# Patient Record
Sex: Male | Born: 1970 | Race: Black or African American | Hispanic: No | Marital: Married | Smoking: Never smoker
Health system: Southern US, Community
[De-identification: ages and names within clinical notes are randomized; demographics above are authoritative.]

---

## 2007-04-25 ENCOUNTER — Ambulatory Visit: Payer: Self-pay | Admitting: Internal Medicine

## 2007-05-06 ENCOUNTER — Ambulatory Visit: Payer: Self-pay | Admitting: Internal Medicine

## 2007-05-13 ENCOUNTER — Ambulatory Visit: Payer: Self-pay | Admitting: Internal Medicine

## 2007-05-29 ENCOUNTER — Ambulatory Visit: Payer: Self-pay | Admitting: Internal Medicine

## 2007-06-29 ENCOUNTER — Ambulatory Visit: Payer: Self-pay | Admitting: Internal Medicine

## 2007-07-12 ENCOUNTER — Ambulatory Visit: Payer: Self-pay | Admitting: Internal Medicine

## 2007-07-29 ENCOUNTER — Ambulatory Visit: Payer: Self-pay | Admitting: Internal Medicine

## 2007-08-03 ENCOUNTER — Observation Stay: Payer: Self-pay | Admitting: Internal Medicine

## 2007-08-03 ENCOUNTER — Other Ambulatory Visit: Payer: Self-pay

## 2007-08-29 ENCOUNTER — Ambulatory Visit: Payer: Self-pay | Admitting: Internal Medicine

## 2007-09-06 ENCOUNTER — Ambulatory Visit: Payer: Self-pay | Admitting: Internal Medicine

## 2007-09-28 ENCOUNTER — Ambulatory Visit: Payer: Self-pay | Admitting: Internal Medicine

## 2007-10-29 ENCOUNTER — Ambulatory Visit: Payer: Self-pay | Admitting: Internal Medicine

## 2007-12-29 ENCOUNTER — Ambulatory Visit: Payer: Self-pay | Admitting: Internal Medicine

## 2008-01-24 ENCOUNTER — Ambulatory Visit: Payer: Self-pay | Admitting: Internal Medicine

## 2008-01-29 ENCOUNTER — Ambulatory Visit: Payer: Self-pay | Admitting: Internal Medicine

## 2008-03-11 ENCOUNTER — Emergency Department: Payer: Self-pay | Admitting: Emergency Medicine

## 2008-06-28 ENCOUNTER — Ambulatory Visit: Payer: Self-pay | Admitting: Internal Medicine

## 2008-07-03 ENCOUNTER — Ambulatory Visit: Payer: Self-pay | Admitting: Internal Medicine

## 2008-07-28 ENCOUNTER — Ambulatory Visit: Payer: Self-pay | Admitting: Internal Medicine

## 2008-08-17 ENCOUNTER — Emergency Department: Payer: Self-pay | Admitting: Emergency Medicine

## 2008-09-27 ENCOUNTER — Ambulatory Visit: Payer: Self-pay | Admitting: Internal Medicine

## 2008-10-08 ENCOUNTER — Ambulatory Visit: Payer: Self-pay | Admitting: Internal Medicine

## 2008-10-09 ENCOUNTER — Ambulatory Visit: Payer: Self-pay | Admitting: Internal Medicine

## 2008-10-28 ENCOUNTER — Ambulatory Visit: Payer: Self-pay | Admitting: Internal Medicine

## 2010-02-10 IMAGING — US US EXTREM LOW VENOUS*L*
1 series · 17 of 24 positions shown · non-contrast
Comparison: none

REASON FOR EXAM: f/u left popliteal thrombosis
COMMENTS:

[Series 1: us extrem low venous*left* · 17 of 32 slices shown]
[im 1/32]
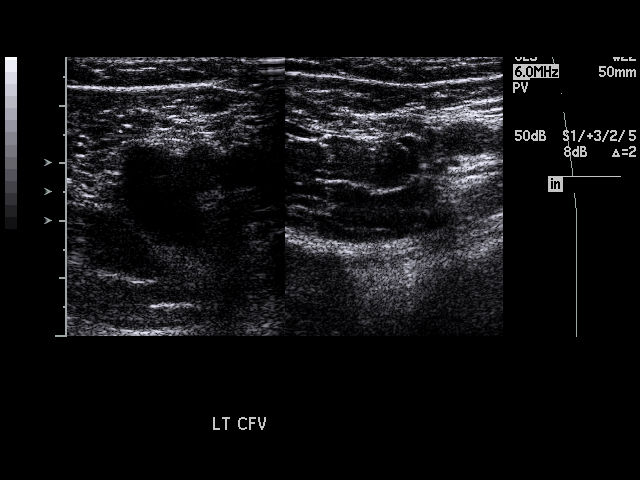
[im 3/32]
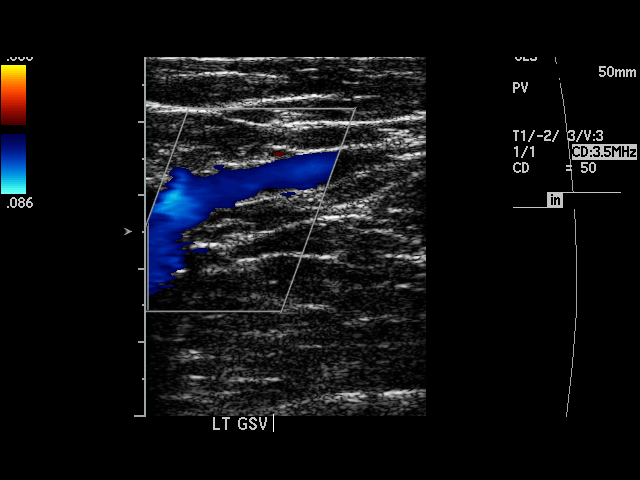
[im 5/32]
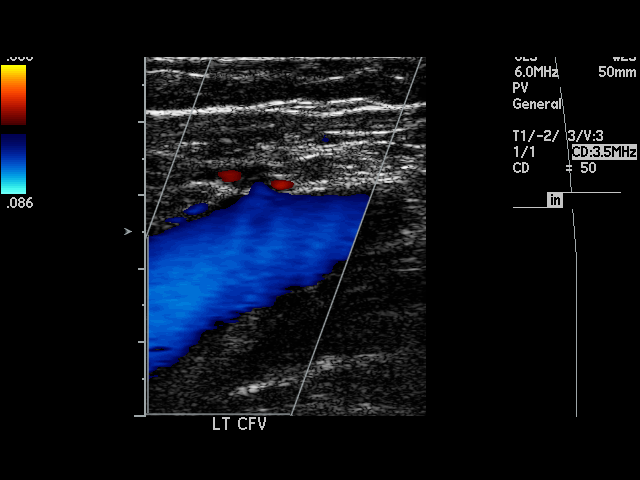
[im 6/32]
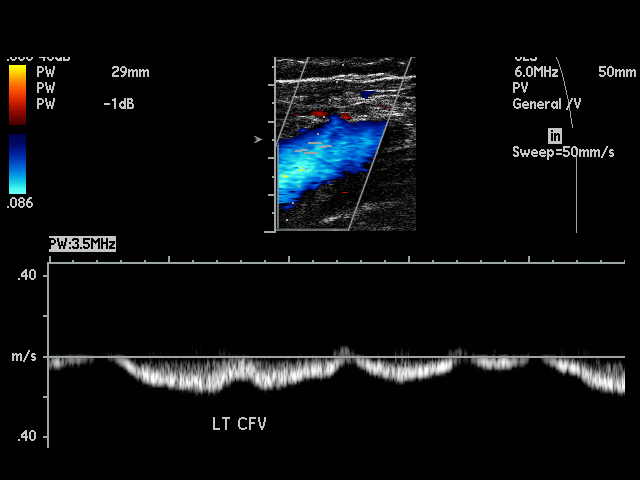
[im 9/32]
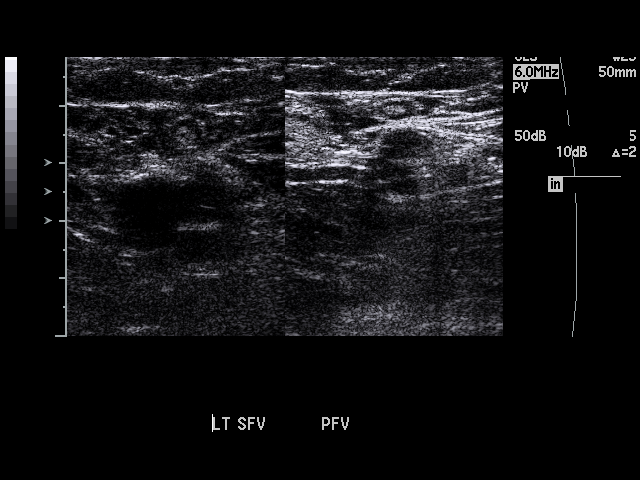
[im 10/32]
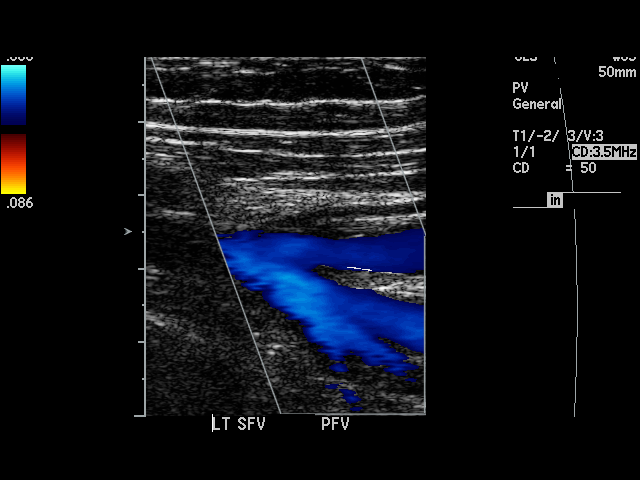
[im 13/32]
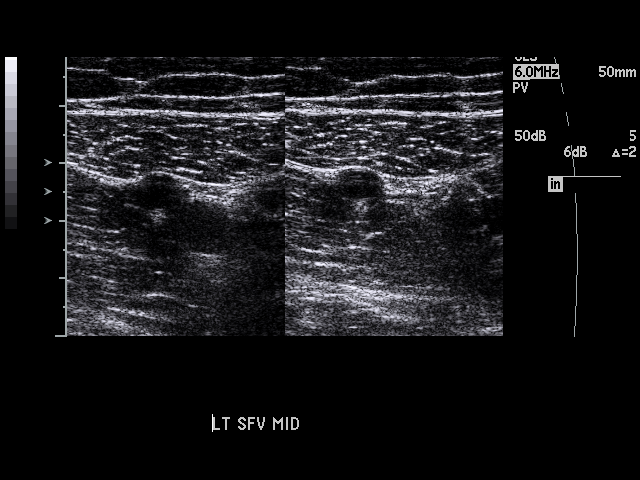
[im 14/32]
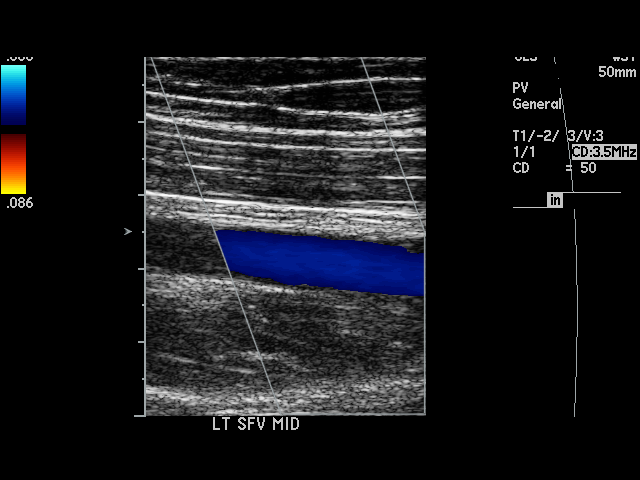
[im 17/32]
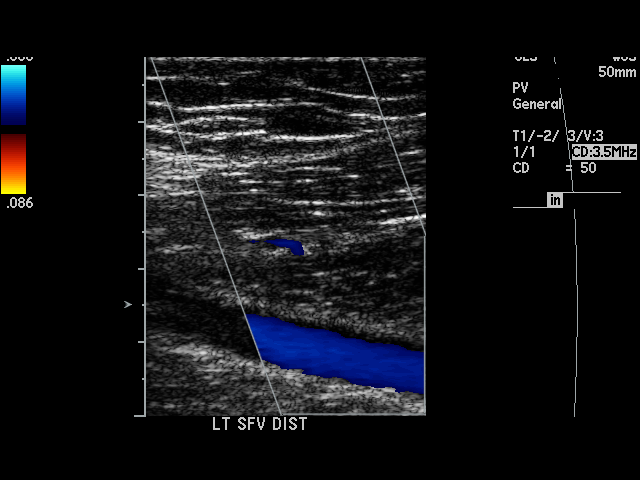
[im 18/32]
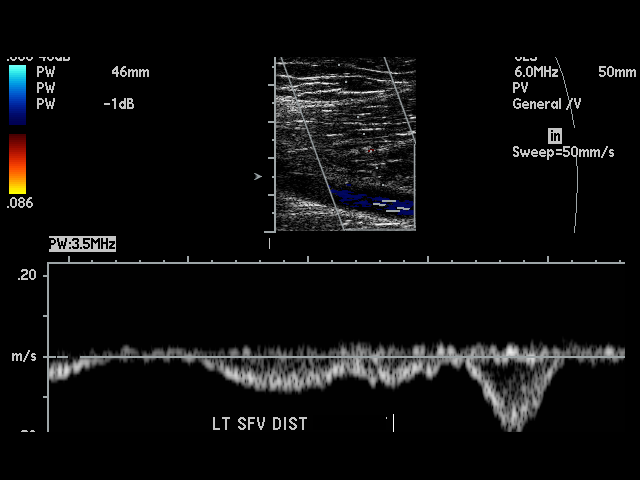
[im 19/32]
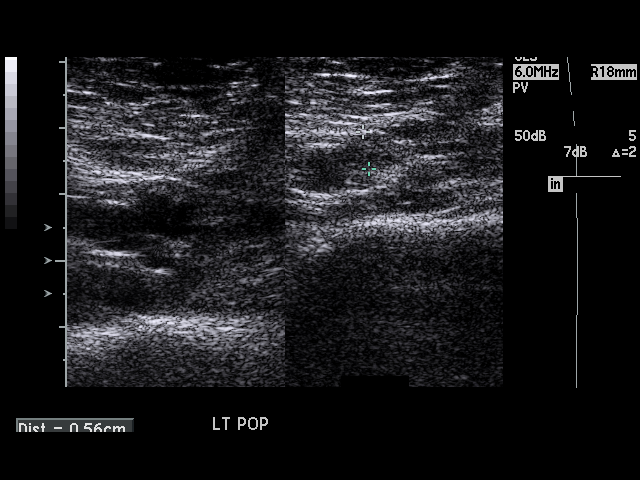
[im 22/32]
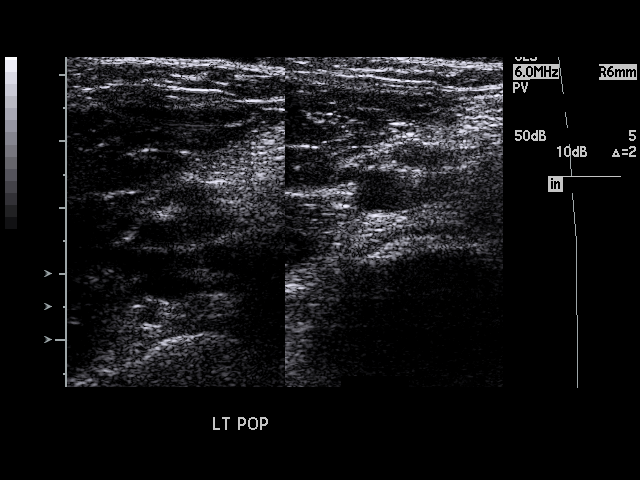
[im 23/32]
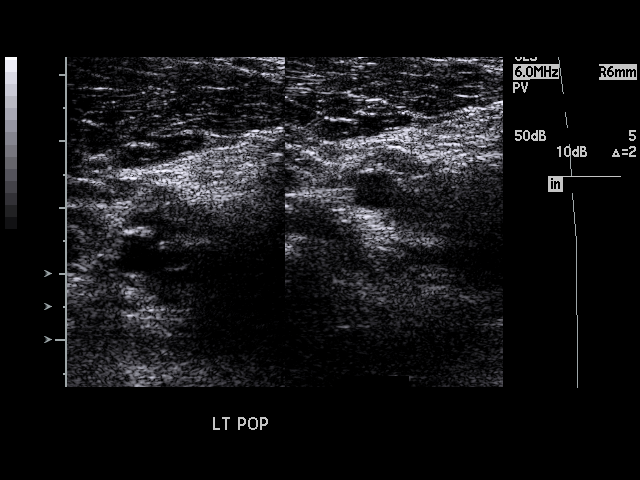
[im 26/32]
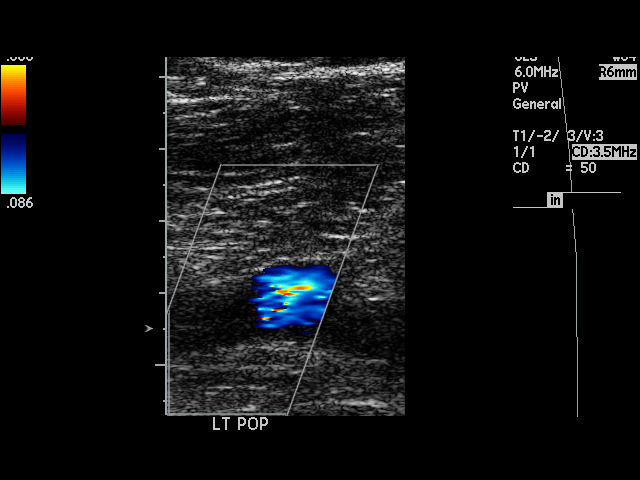
[im 27/32]
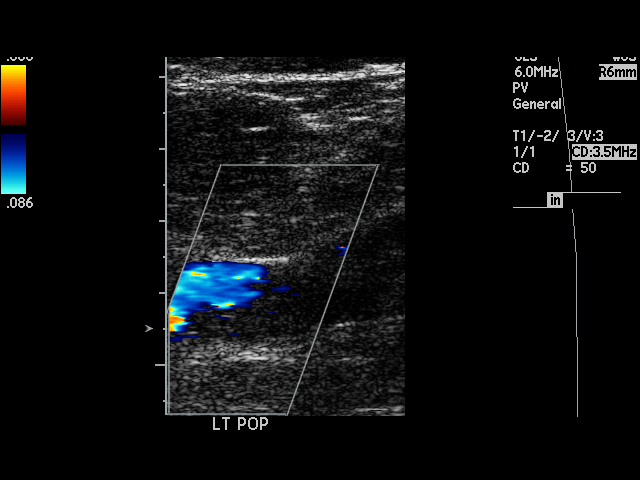
[im 29/32]
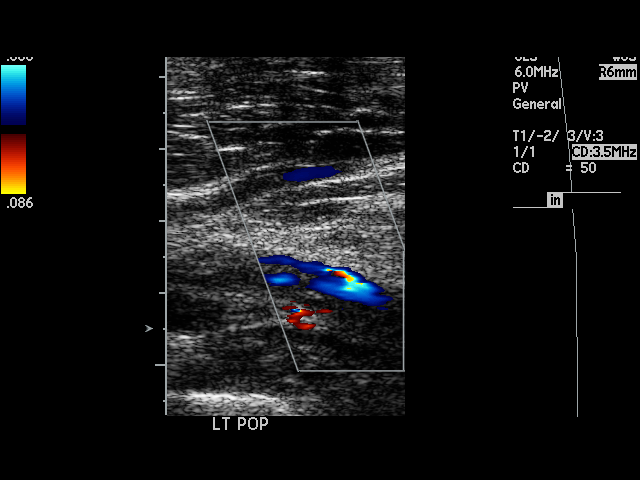
[im 32/32]
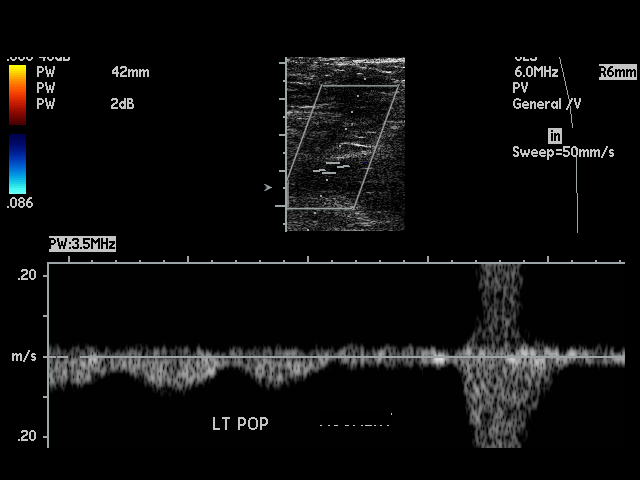

[17 of 24 positions shown; findings below may reference images not displayed]

PROCEDURE:     US  - US DOPPLER LOW EXTR LEFT  - October 08, 2008  [DATE]

RESULT:     On the left there remains nonocclusive thrombus in the popliteal
vein similar to that seen on the study of approximately one year ago. The
superficial and common femoral veins appear normal. The waveform patterns
are normal.
IMPRESSION: There is nonocclusive thrombus in the popliteal vein on the
left. This appears stable when compared to the prior study from June 2008
and September 2007.

## 2016-07-12 ENCOUNTER — Emergency Department
Admission: EM | Admit: 2016-07-12 | Discharge: 2016-07-13 | Disposition: A | Discharge status: Home / Self Care | Payer: BC Managed Care – PPO | Attending: Emergency Medicine | Admitting: Emergency Medicine

## 2016-07-12 DIAGNOSIS — H6991 Unspecified Eustachian tube disorder, right ear: Secondary | ICD-10-CM | POA: Diagnosis not present

## 2016-07-12 DIAGNOSIS — H6981 Other specified disorders of Eustachian tube, right ear: Secondary | ICD-10-CM

## 2016-07-12 DIAGNOSIS — H9201 Otalgia, right ear: Secondary | ICD-10-CM | POA: Diagnosis present

## 2016-07-12 NOTE — ED Triage Notes (Signed)
Pt states that tonight while eating he started having a sharp pain in his rt ear that radiated down the rt side of his neck into his rt chest, pt states is tender when he touches the chest, denies any fever, or trauma to teeth with eating, no distress noted at this time

## 2016-07-13 MED ORDER — IBUPROFEN 800 MG PO TABS
800.0000 mg | ORAL_TABLET | Freq: Once | ORAL | Status: AC
  Administered 2016-07-13: 800 mg via ORAL
  Filled 2016-07-13: qty 1

## 2016-07-13 MED ORDER — METHYLPREDNISOLONE 4 MG PO TBPK: ORAL_TABLET | ORAL | 0 refills | Status: AC

## 2016-07-13 MED ORDER — PREDNISONE 20 MG PO TABS
40.0000 mg | ORAL_TABLET | Freq: Once | ORAL | Status: AC
  Administered 2016-07-13: 40 mg via ORAL
  Filled 2016-07-13: qty 2

## 2016-07-13 NOTE — ED Provider Notes (Signed)
[facility] Emergency Department Provider Note   ____________________________________________   First MD Initiated Contact with Patient 07/13/16 [phone]     (approximate)  I have reviewed the triage vital signs and the nursing notes.   HISTORY  Chief Complaint Otalgia    HPI Angel Hampton is a 46 y.o. male who presents to the ED from home with a chief complaint of right ear pain.Patient reports sinus allergy symptoms for the past several days. Last night while eating he felt a sharp pain in his right ear that radiated to the right side of his neck and into his right chest. Denies drainage from right ear. Denies dental injury or fracture. Denies recent barotrauma or swimming. Denies fever, chills, shortness of breath, nausea, vomiting, diarrhea. Denies recent travel or trauma. Has not tried anything for the pain.   Past medical history None  There are no active problems to display for this patient.   No past surgical history on file.  Prior to Admission medications   Medication Sig Start Date End Date Taking? Authorizing Provider  methylPREDNISolone (MEDROL DOSEPAK) 4 MG TBPK tablet Take as directed 07/13/16   Irean Hong, MD    Allergies Patient has no known allergies.  No family history on file.  Social History Social History  Substance Use Topics  . Smoking status: Not on file  . Smokeless tobacco: Not on file  . Alcohol use Not on file  Denies recent alcohol use  Review of Systems  Constitutional: No fever/chills. Eyes: No visual changes. ENT: Positive for right ear pain. No sore throat. Cardiovascular: Denies chest pain. Respiratory: Denies shortness of breath. Gastrointestinal: No abdominal pain.  No nausea, no vomiting.  No diarrhea.  No constipation. Genitourinary: Negative for dysuria. Musculoskeletal: Negative for back pain. Skin: Negative for rash. Neurological: Negative for headaches, focal weakness or  numbness.  10-point ROS otherwise negative.  ____________________________________________   PHYSICAL EXAM:  VITAL SIGNS: ED Triage Vitals  Enc Vitals Group     BP 07/12/16 2238 120/72     Pulse Rate 07/12/16 2238 75     Resp 07/12/16 2238 18     Temp 07/12/16 2238 98.9 F (37.2 C)     Temp Source 07/12/16 2238 Oral     SpO2 07/12/16 2238 96 %     Weight 07/12/16 2239 220 lb (99.8 kg)     Height 07/12/16 2239  (1.803 m)     Head Circumference --      Peak Flow --      Pain Score 07/12/16 2238 5     Pain Loc --      Pain Edu? --      Excl. in GC? --     Constitutional: Alert and oriented. Well appearing and in no acute distress. Eyes: Conjunctivae are normal. PERRL. EOMI. Head: Atraumatic. Ears: Left TM within normal limits. Right TM bulging with clear fluid, no perforation. Nose: No congestion/rhinnorhea. Mouth/Throat: Mucous membranes are moist.  Oropharynx mildly erythematous without tonsillar swelling, exudates or peritonsillar abscess. There is no hoarse or muffled voice. There is no drooling. Neck: No stridor.  No carotid bruits. Hematological/Lymphatic/Immunilogical: No cervical lymphadenopathy. Cardiovascular: Normal rate, regular rhythm. Grossly normal heart sounds.  Good peripheral circulation. Respiratory: Normal respiratory effort.  No retractions. Lungs CTAB. Gastrointestinal: Soft and nontender. No distention. No abdominal bruits. No CVA tenderness. Musculoskeletal: No lower extremity tenderness nor edema.  No joint effusions. Neurologic:  Normal speech and language. No gross focal neurologic  deficits are appreciated. No gait instability. Skin:  Skin is warm, dry and intact. No rash noted. Psychiatric: Mood and affect are normal. Speech and behavior are normal.  ____________________________________________   LABS (all labs ordered are listed, but only abnormal results are displayed)  Labs Reviewed - No data to  display ____________________________________________  EKG  None ____________________________________________  RADIOLOGY  None ____________________________________________   PROCEDURES  Procedure(s) performed: None  Procedures  Critical Care performed: No  ____________________________________________   INITIAL IMPRESSION / ASSESSMENT AND PLAN / ED COURSE  Pertinent labs & imaging results that were available during my care of the patient were reviewed by me and considered in my medical decision making (see chart for details).  46 year old male who presents with right otalgia secondary to eustachian tube dysfunction. Will start Medrol Dosepak and patient will follow-up with his PCP this week. Strict return precautions given. Patient verbalizes understanding and agrees with plan of care.      ____________________________________________   FINAL CLINICAL IMPRESSION(S) / ED DIAGNOSES  Final diagnoses:  Otalgia of right ear  Dysfunction of right eustachian tube      NEW MEDICATIONS STARTED DURING THIS VISIT:  Discharge Medication List as of 07/13/2016  1:01 AM    START taking these medications   Details  methylPREDNISolone (MEDROL DOSEPAK) 4 MG TBPK tablet Take as directed, Print         Note:  This document was prepared using Dragon voice recognition software and may include unintentional dictation errors.    Irean Hong, MD 07/13/16 [phone]

## 2016-07-13 NOTE — ED Notes (Signed)
Patient with complaint of pain to his right ear radiating down to his neck that started when he was eating dinner.

## 2016-07-13 NOTE — Discharge Instructions (Signed)
1. Take medrol dose pack as prescribed. 2. You may take ibuprofen as needed for discomfort. 3. Return to the ER for worsening symptoms, persistent vomiting, fever or other concerns.

## 2018-12-02 ENCOUNTER — Other Ambulatory Visit: Payer: Self-pay | Admitting: *Deleted

## 2018-12-02 DIAGNOSIS — Z20822 Contact with and (suspected) exposure to covid-19: Secondary | ICD-10-CM

## 2018-12-04 LAB — NOVEL CORONAVIRUS, NAA: SARS-CoV-2, NAA: NOT DETECTED

## 2019-01-09 ENCOUNTER — Other Ambulatory Visit: Payer: Self-pay

## 2019-01-09 DIAGNOSIS — Z20822 Contact with and (suspected) exposure to covid-19: Secondary | ICD-10-CM

## 2019-01-10 LAB — NOVEL CORONAVIRUS, NAA: SARS-CoV-2, NAA: NOT DETECTED

## 2020-01-18 NOTE — Progress Notes (Signed)
   01/19/2020 8:33 AM   Wynelle Fanny [birth year] [id number]  Referring provider: [facility] [address] [place],  [place] [address] Chief Complaint  Patient presents with  . Testicle Pain    HPI: MARGUIS MATHIESON is a 49 y.o. male who presents today for evaluation and management of low back and scrotal pain.   -Symptoms started 2 weeks ago. -With acute low back pain radiating to buttock and left scrotum -Received Toradol injection and started on Flexeril and prednisone -Marked improvement in back and hemiscrotal pain -Denies hematuria and dysuria.  -Noted increased urinary frequency while taking prednisone -His grandfather had prostate cancer. His father was recent diagnosed back in 12/2018 with prostate cancer.  -His PCP is following PSA screening. PSA was 0.60 on 05/12/2019.   PMH: History reviewed. No pertinent past medical history.  Surgical History: History reviewed. No pertinent surgical history.  Home Medications:  Allergies as of 01/19/2020   No Known Allergies     Medication List       Accurate as of January 19, 2020  8:33 AM. If you have any questions, ask your nurse or doctor.        cyclobenzaprine 5 MG tablet Commonly known as: FLEXERIL Take by mouth.   methylPREDNISolone 4 MG Tbpk tablet Commonly known as: MEDROL DOSEPAK Take as directed       Allergies: No Known Allergies  Family History: History reviewed. No pertinent family history.  Social History:  reports that he has never smoked. He has never used smokeless tobacco. No history on file for alcohol use and drug use.   Physical Exam: BP 130/82   Pulse 80   Ht 5\' 11"  (1.803 m)   Wt 230 lb (104.3 kg)   BMI 32.08 kg/m   Constitutional:  Alert and oriented, No acute distress. HEENT: [place] AT, moist mucus membranes.  Trachea midline, no masses. Cardiovascular: No clubbing, cyanosis, or edema. Respiratory: Normal respiratory effort, no increased work of breathing. GI: Abdomen  is soft, nontender, nondistended, no abdominal masses GU: No CVA tenderness, 30 prostate smooth no nodules.  Phallus without lesions, testes descended bilaterally without masses or tenderness.  Spermatic cord/epididymis palpably normal bilaterally Lymph: No cervical or inguinal lymphadenopathy. Skin: No rashes, bruises or suspicious lesions. Neurologic: Grossly intact, no focal deficits, moving all 4 extremities. Psychiatric: Normal mood and affect.  Laboratory Data:  Urinalysis Dipstick/microscopy negative   Assessment & Plan:    1. Left scrotal pain  Associated with onset of acute low back pain and most likely neuropathic in etiology  UA unremarkable  Continue current management  If scrotal pain persists consider trial tamsulosin for possible prostatic inflammation   2. Family history of prostate cancer  PSA was stable at 0.60 on 05/12/2019  Benign DRE  PSA is monitored annually by PCP   [facility] [address], [address] [place], [place] [phone]  I, [phone], am acting as a scribe for Dr. Theador Hawthorne C. Pharell Rolfson,  I have reviewed the above documentation for accuracy and completeness, and I agree with the above.   Lorin Picket, MD

## 2020-01-19 ENCOUNTER — Other Ambulatory Visit: Payer: Self-pay

## 2020-01-19 ENCOUNTER — Encounter: Payer: Self-pay | Admitting: Urology

## 2020-01-19 ENCOUNTER — Ambulatory Visit (INDEPENDENT_AMBULATORY_CARE_PROVIDER_SITE_OTHER): Payer: BC Managed Care – PPO | Admitting: Urology

## 2020-01-19 VITALS — BP 130/82 | HR 80 | Ht 71.0 in | Wt 230.0 lb

## 2020-01-19 DIAGNOSIS — N5082 Scrotal pain: Secondary | ICD-10-CM

## 2020-01-20 LAB — MICROSCOPIC EXAMINATION
Bacteria, UA: NONE SEEN
Epithelial Cells (non renal): NONE SEEN /hpf (ref 0–10)

## 2020-01-20 LAB — URINALYSIS, COMPLETE
Bilirubin, UA: NEGATIVE
Ketones, UA: NEGATIVE
Leukocytes,UA: NEGATIVE
Nitrite, UA: NEGATIVE
Protein,UA: NEGATIVE
RBC, UA: NEGATIVE
Specific Gravity, UA: 1.01 (ref 1.005–1.030)
Urobilinogen, Ur: 0.2 mg/dL (ref 0.2–1.0)
pH, UA: 5.5 (ref 5.0–7.5)

## 2020-01-23 ENCOUNTER — Encounter: Payer: Self-pay | Admitting: Urology

## 2020-01-25 ENCOUNTER — Other Ambulatory Visit: Payer: Self-pay | Admitting: *Deleted

## 2020-01-25 DIAGNOSIS — N5082 Scrotal pain: Secondary | ICD-10-CM

## 2020-01-25 MED ORDER — TAMSULOSIN HCL 0.4 MG PO CAPS: 0.4000 mg | ORAL_CAPSULE | Freq: Every day | ORAL | 0 refills | Status: AC
